# Patient Record
Sex: Female | Born: 1970 | Race: Black or African American | Hispanic: No | Marital: Married | Smoking: Never smoker
Health system: Southern US, Community
[De-identification: ages and names within clinical notes are randomized; demographics above are authoritative.]

## PROBLEM LIST (undated history)

## (undated) DIAGNOSIS — E079 Disorder of thyroid, unspecified: Secondary | ICD-10-CM

## (undated) DIAGNOSIS — Z800 Family history of malignant neoplasm of digestive organs: Secondary | ICD-10-CM

## (undated) DIAGNOSIS — R519 Headache, unspecified: Secondary | ICD-10-CM

## (undated) DIAGNOSIS — Z803 Family history of malignant neoplasm of breast: Secondary | ICD-10-CM

## (undated) DIAGNOSIS — F419 Anxiety disorder, unspecified: Secondary | ICD-10-CM

## (undated) DIAGNOSIS — I10 Essential (primary) hypertension: Secondary | ICD-10-CM

## (undated) DIAGNOSIS — Z801 Family history of malignant neoplasm of trachea, bronchus and lung: Secondary | ICD-10-CM

## (undated) DIAGNOSIS — R51 Headache: Secondary | ICD-10-CM

## (undated) HISTORY — PX: ABDOMINAL HYSTERECTOMY: SHX81

## (undated) HISTORY — PX: BREAST SURGERY: SHX581

## (undated) HISTORY — DX: Family history of malignant neoplasm of breast: Z80.3

## (undated) HISTORY — DX: Family history of malignant neoplasm of digestive organs: Z80.0

## (undated) HISTORY — DX: Family history of malignant neoplasm of trachea, bronchus and lung: Z80.1

---

## 1999-04-12 ENCOUNTER — Ambulatory Visit (HOSPITAL_COMMUNITY): Admission: RE | Admit: 1999-04-12 | Discharge: 1999-04-12 | Payer: Self-pay | Admitting: Family Medicine

## 1999-04-12 ENCOUNTER — Encounter: Payer: Self-pay | Admitting: Family Medicine

## 1999-11-12 HISTORY — PX: REDUCTION MAMMAPLASTY: SUR839

## 1999-12-05 ENCOUNTER — Other Ambulatory Visit: Admission: RE | Admit: 1999-12-05 | Discharge: 1999-12-05 | Payer: Self-pay | Admitting: Gynecology

## 1999-12-31 ENCOUNTER — Encounter (INDEPENDENT_AMBULATORY_CARE_PROVIDER_SITE_OTHER): Payer: Self-pay | Admitting: *Deleted

## 1999-12-31 ENCOUNTER — Ambulatory Visit (HOSPITAL_BASED_OUTPATIENT_CLINIC_OR_DEPARTMENT_OTHER): Admission: RE | Admit: 1999-12-31 | Discharge: 2000-01-01 | Payer: Self-pay | Admitting: Specialist

## 2002-10-21 ENCOUNTER — Encounter: Payer: Self-pay | Admitting: Emergency Medicine

## 2002-10-21 ENCOUNTER — Emergency Department (HOSPITAL_COMMUNITY): Admission: EM | Admit: 2002-10-21 | Discharge: 2002-10-21 | Payer: Self-pay | Admitting: Emergency Medicine

## 2002-10-25 ENCOUNTER — Ambulatory Visit (HOSPITAL_BASED_OUTPATIENT_CLINIC_OR_DEPARTMENT_OTHER): Admission: RE | Admit: 2002-10-25 | Discharge: 2002-10-25 | Payer: Self-pay | Admitting: *Deleted

## 2003-05-17 ENCOUNTER — Encounter: Payer: Self-pay | Admitting: Internal Medicine

## 2003-05-17 ENCOUNTER — Encounter: Admission: RE | Admit: 2003-05-17 | Discharge: 2003-05-17 | Payer: Self-pay | Admitting: Internal Medicine

## 2007-06-11 ENCOUNTER — Other Ambulatory Visit: Admission: RE | Admit: 2007-06-11 | Discharge: 2007-06-11 | Payer: Self-pay | Admitting: Gynecology

## 2007-09-02 ENCOUNTER — Encounter: Admission: RE | Admit: 2007-09-02 | Discharge: 2007-09-02 | Payer: Self-pay | Admitting: Gynecology

## 2008-09-05 ENCOUNTER — Encounter: Admission: RE | Admit: 2008-09-05 | Discharge: 2008-09-05 | Payer: Self-pay | Admitting: Obstetrics and Gynecology

## 2011-03-06 ENCOUNTER — Other Ambulatory Visit: Payer: Self-pay | Admitting: Obstetrics and Gynecology

## 2011-03-06 DIAGNOSIS — Z1231 Encounter for screening mammogram for malignant neoplasm of breast: Secondary | ICD-10-CM

## 2011-03-18 ENCOUNTER — Ambulatory Visit: Payer: Self-pay

## 2011-03-25 ENCOUNTER — Ambulatory Visit
Admission: RE | Admit: 2011-03-25 | Discharge: 2011-03-25 | Disposition: A | Discharge status: Home / Self Care | Payer: Commercial Managed Care - PPO | Source: Ambulatory Visit | Attending: Obstetrics and Gynecology | Admitting: Obstetrics and Gynecology

## 2011-03-25 DIAGNOSIS — Z1231 Encounter for screening mammogram for malignant neoplasm of breast: Secondary | ICD-10-CM

## 2011-03-29 NOTE — Op Note (Signed)
[place]  Patient:    Rebecca Daniels, Rebecca Daniels                    MRN: [id number] Proc. Date: 12/31/99 Attending:  Earvin Hansen L. Shon Hough, M.D. CC:         Yaakov Guthrie. Shon Hough, M.D. (2)                           Operative Report  INDICATIONS:  A 40 year old lady with severe macromastia, back and shoulder pain, secondary to large pendulous breasts.  She has tried conservative treatment in he past, which failed.  She has increased pitting of both shoulder areas as well as a history of intertrigo.  PROCEDURE:  Bilateral breast reductions using the inferior pedicle technique.  SURGEON:  Yaakov Guthrie. Shon Hough, M.D.  ASSISTANT:  Margaretha Sheffield.  All procedures in detail were explained to the patient, as well as the attendant risks and possible complications.  The patient consents to surgery.  DESCRIPTION OF PROCEDURE:  The patient was taken to the operating room and placed on the operating room table in the sitting position.  She was given drawings for the inferior pedicle reduction mammoplasty, remarking the nipple-areolar complexes to 20.0 cm from the suprasternal notch.  After this she then underwent general anesthesia, was intubated orally.  A prep was done to the chest and breast areas in a routine fashion using Betadine soap and solution, and walled off with sterile  towels and drapes, so as to make a sterile field.  The wounds were injected with 0.25% Xylocaine with epinephrine 1:400,000 concentration, a total of 150 cc per  side.  Next, the wounds were scored after the sterile towels and drapes had been placed, with a #15 blade and the skin of the inferior pedicle was de-epithelialized using a #20 blade.  Medial and lateral fatty dermal pedicles were excised down o underlying fascia.  Hemostasis was maintained with the Bovie unit again on coagulation.  Out laterally more tissue was taken to improve the lateral contour. After this, after  proper hemostasis, the new key hole area was also debulked, then the flaps were transposed and stayed with #3-0 Prolene.  The subcutaneous closure was done with #3-0 Vicryl x 2 layers, then a running subcuticular stitch of #3-0 Vicryl and #5-0 Vicryl throughout the inverted-T.  The wound were drained with 10 Blake drains, which were placed in the depths of the wound and brought out through the lateral-most portion of the incision and secured with #3-0 Prolene. Steri-Strips and soft dressing were applied, including Xeroform, 4 x 4s, ABDs, nd Hypafix tape.  She withstood the procedures very well.  The estimated blood loss was 100 cc. Complications:  None.  She was then taken to the recovery room in excellent condition.  The nipples were supple with good blood supply. DD:  12/31/99 TD:  12/31/99 Job: [id number]

## 2011-03-29 NOTE — Op Note (Signed)
NAME:  Rebecca Daniels, Rebecca Daniels                       ACCOUNT NO.:  [id number]   MEDICAL RECORD NO.:  [id number]                   PATIENT TYPE:  AMB   LOCATION:  DSC                                  FACILITY:  [facility]   PHYSICIAN:  Lowell Bouton, M.D.      DATE OF BIRTH:  April 19, 1971   DATE OF PROCEDURE:  DATE OF DISCHARGE:                                 OPERATIVE REPORT   PREOPERATIVE DIAGNOSIS:  Lacerated flexor pollicis longus tendon, left  thumb.   POSTOPERATIVE DIAGNOSIS:  Lacerated flexor pollicis longus tendon, left  thumb.   PROCEDURE:  Repair of flexor pollicis longus tendon of the left thumb.   SURGEON:  Lowell Bouton, M.D.   ANESTHESIA:  General.   OPERATIVE FINDINGS:  The patient had an oblique laceration of the flexor  pollicis longus tendon at the level of the MP joint.  The proximal end had  retracted down into the thenar eminence area.  The digital nerves were  intact.   PROCEDURE:  Under general anesthesia with a tourniquet on the left arm, the  left hand was prepped and draped in the usual fashion.  After elevating the  limb the tourniquet was inflated to 225 mmHg.  The  previous sutures were  removed and the laceration was extended proximally in a zig zag fashion.  Blunt dissection was carried down to the flexor sheath and the flaps were  retracted with skin hooks.  The distal end of the tendon was present in the  wound and the flexor sheath was identified.  A very fine hemostat was placed  through the sheath proximally down into thenar imminence attempting to  retrieve the proximal end of the tendon.  The tendon was milked distally by  flexing the wrist and squeezing on the forearm and the thenar imminence.  It  was not successfully retrieved.  A sterile tendon passer was then placed  proximally and again the tendon could not be retrieved.  At this point a  longitudinal incision was made over the volar aspect of the wrist overlying  the FCR  tendon.  Blunt dissection was carried to the subcutaneous tissues  and bleeding points were coagulated.  Sharp dissection was carried through  the FCR sheath and the tendon was retracted ulnarly.  The floor of the  sheath was incised longitudinally and blunt dissection was carried down to  the FPL tendon.  The proximal end of the tendon was grasped with the forceps  and on pulling on it the tendon did not come into the wrist. It was  obviously adherent along the thenar imminence area.  The incision on the  thumb was then made more proximal.  A longitudinal incision was made in the  tendon sheath proximal to the MP area and a fine hemostat was placed into  the sheath eventually getting the proximal portion of the tendon that was  lacerated.  This was pulled out distally and a 3-0 [employer]  suture  was inserted along with a second 3-0 Ethibond core suture.  The distal end  of the tendon had a 3-0 Ethibond placed in a core suture and was passed from  distal to proximal at the level of the A1 pulley.  The repair was then  performed just proximal to the A1 pulley after putting 25 gauge needles in  the ends of the tendon to keep them from retracting.  The core suture was  repaired to the proximal core suture and then the Kessler suture was used  from the proximal tendon and the repair performed in a Kessler fashion on  the distal stump.  This allowed two core sutures.  The epitenon stitch was  performed with a 5-0 nylon suture.  This allowed for good gliding of the  tendon.  The wound was irrigated copiously with saline.  The skin was closed  with 4-0 nylon sutures.  The proximal wound was irrigated.  The subcutaneous  tissue was closed  with 4-0 Vicryl and the skin with a 3-0 subcuticular Prolene.  Steri-Strips  were applied followed by sterile dressings and a thumb spica splint.  The  tourniquet was released with good circulation of the hand.  1/2% Marcaine  was inserted into the wounds  for pain control.  The patient went to the  recovery room in stable good condition.                                               Lowell Bouton, M.D.    EMM/MEDQ  D:  10/25/2002  T:  10/25/2002  Job:  [id number]

## 2012-05-05 ENCOUNTER — Other Ambulatory Visit: Payer: Self-pay | Admitting: *Deleted

## 2012-05-05 ENCOUNTER — Other Ambulatory Visit: Payer: Self-pay | Admitting: Obstetrics and Gynecology

## 2012-05-05 DIAGNOSIS — Z1231 Encounter for screening mammogram for malignant neoplasm of breast: Secondary | ICD-10-CM

## 2012-05-13 ENCOUNTER — Ambulatory Visit
Admission: RE | Admit: 2012-05-13 | Discharge: 2012-05-13 | Disposition: A | Discharge status: Home / Self Care | Payer: Commercial Managed Care - PPO | Source: Ambulatory Visit | Attending: Obstetrics and Gynecology | Admitting: Obstetrics and Gynecology

## 2012-05-13 DIAGNOSIS — Z1231 Encounter for screening mammogram for malignant neoplasm of breast: Secondary | ICD-10-CM

## 2013-08-06 ENCOUNTER — Other Ambulatory Visit: Payer: Self-pay

## 2013-08-06 DIAGNOSIS — Z1231 Encounter for screening mammogram for malignant neoplasm of breast: Secondary | ICD-10-CM

## 2013-08-20 ENCOUNTER — Ambulatory Visit
Admission: RE | Admit: 2013-08-20 | Discharge: 2013-08-20 | Disposition: A | Discharge status: Home / Self Care | Payer: Commercial Managed Care - PPO | Source: Ambulatory Visit

## 2013-08-20 DIAGNOSIS — Z1231 Encounter for screening mammogram for malignant neoplasm of breast: Secondary | ICD-10-CM

## 2014-11-25 ENCOUNTER — Emergency Department (HOSPITAL_BASED_OUTPATIENT_CLINIC_OR_DEPARTMENT_OTHER)
Admission: EM | Admit: 2014-11-25 | Discharge: 2014-11-26 | Disposition: A | Discharge status: Home / Self Care | Payer: BLUE CROSS/BLUE SHIELD | Attending: Emergency Medicine | Admitting: Emergency Medicine

## 2014-11-25 ENCOUNTER — Encounter (HOSPITAL_BASED_OUTPATIENT_CLINIC_OR_DEPARTMENT_OTHER): Payer: Self-pay | Admitting: Emergency Medicine

## 2014-11-25 DIAGNOSIS — E079 Disorder of thyroid, unspecified: Secondary | ICD-10-CM | POA: Insufficient documentation

## 2014-11-25 DIAGNOSIS — Y9389 Activity, other specified: Secondary | ICD-10-CM | POA: Insufficient documentation

## 2014-11-25 DIAGNOSIS — W228XXA Striking against or struck by other objects, initial encounter: Secondary | ICD-10-CM | POA: Diagnosis not present

## 2014-11-25 DIAGNOSIS — Y9289 Other specified places as the place of occurrence of the external cause: Secondary | ICD-10-CM | POA: Insufficient documentation

## 2014-11-25 DIAGNOSIS — I10 Essential (primary) hypertension: Secondary | ICD-10-CM | POA: Insufficient documentation

## 2014-11-25 DIAGNOSIS — S75212A Minor laceration of greater saphenous vein at hip and thigh level, left leg, initial encounter: Secondary | ICD-10-CM | POA: Diagnosis present

## 2014-11-25 DIAGNOSIS — Y998 Other external cause status: Secondary | ICD-10-CM | POA: Diagnosis not present

## 2014-11-25 DIAGNOSIS — F419 Anxiety disorder, unspecified: Secondary | ICD-10-CM | POA: Diagnosis not present

## 2014-11-25 DIAGNOSIS — Z79899 Other long term (current) drug therapy: Secondary | ICD-10-CM | POA: Diagnosis not present

## 2014-11-25 DIAGNOSIS — S71112A Laceration without foreign body, left thigh, initial encounter: Secondary | ICD-10-CM

## 2014-11-25 HISTORY — DX: Disorder of thyroid, unspecified: E07.9

## 2014-11-25 HISTORY — DX: Essential (primary) hypertension: I10

## 2014-11-25 HISTORY — DX: Anxiety disorder, unspecified: F41.9

## 2014-11-25 HISTORY — DX: Headache: R51

## 2014-11-25 HISTORY — DX: Headache, unspecified: R51.9

## 2014-11-25 MED ORDER — LIDOCAINE-EPINEPHRINE 1 %-1:100000 IJ SOLN
INTRAMUSCULAR | Status: AC
  Administered 2014-11-25: 1 mL
  Filled 2014-11-25: qty 1

## 2014-11-25 MED ORDER — CEPHALEXIN 500 MG PO CAPS: 500.0000 mg | ORAL_CAPSULE | Freq: Four times a day (QID) | ORAL | Status: AC

## 2014-11-25 MED ORDER — CEPHALEXIN 250 MG PO CAPS
1000.0000 mg | ORAL_CAPSULE | Freq: Once | ORAL | Status: AC
  Administered 2014-11-25: 1000 mg via ORAL
  Filled 2014-11-25: qty 4

## 2014-11-25 NOTE — ED Notes (Signed)
Patient states that she cut her left lower extremity on a truck this evening.

## 2014-11-25 NOTE — ED Provider Notes (Signed)
CSN: [id number]     Arrival date & time 11/25/14  2259 History  This chart was scribed for Rebecca SeamenJohn L Milianna Ericsson, MD by Richarda Overlieichard Holland, ED Scribe. This patient was seen in room MH06/MH06 and the patient's care was started 11:26 PM.    Chief Complaint  Patient presents with  . Extremity Laceration   The history is provided by the patient. No language interpreter was used.   HPI Comments: Rebecca NiemannCarolista M Wiler is a 44 y.o. female who presents to the Emergency Department complaining of a left thigh laceration that occurred earlier this evening. Pt states she cut her left thigh on a truck when they were moving. She reports minimal associated pain and the wound only bled slightly. Pt reports she is UTD on her tetanus. She reports no alleviating or modifying factors at this time. She denies other injury.   Past Medical History  Diagnosis Date  . Hypertension   . Headache   . Thyroid disease   . Anxiety    Past Surgical History  Procedure Laterality Date  . Cesarean section    . Abdominal hysterectomy    . Breast surgery     History reviewed. No pertinent family history. History  Substance Use Topics  . Smoking status: Never Smoker   . Smokeless tobacco: Not on file  . Alcohol Use: No   OB History    No data available     Review of Systems  Skin: Positive for wound.  All other systems reviewed and are negative.   Allergies  Review of patient's allergies indicates no known allergies.  Home Medications   Prior to Admission medications   Medication Sig Start Date End Date Taking? Authorizing Provider  citalopram (CELEXA) 20 MG tablet Take 20 mg by mouth daily.   Yes Historical Provider, MD  hydrochlorothiazide (MICROZIDE) 12.5 MG capsule Take 12.5 mg by mouth daily.   Yes Historical Provider, MD  levothyroxine (SYNTHROID, LEVOTHROID) 112 MCG tablet Take 112 mcg by mouth daily before breakfast.   Yes Historical Provider, MD  lisinopril (PRINIVIL,ZESTRIL) 20 MG tablet Take 20 mg by mouth  daily.   Yes Historical Provider, MD   BP 143/95 mmHg  Pulse 65  Temp(Src) 98 F (36.7 C) (Oral)  Resp 16  Wt 188 lb (85.276 kg)  SpO2 100%  LMP 02/23/2011   Physical Exam  General: Well-developed, well-nourished female in no acute distress; appearance consistent with age of record HENT: normocephalic; atraumatic Eyes: pupils equal, round and reactive to light; extraocular muscles intact Neck: supple Heart: regular rate and rhythm Lungs: clear to auscultation bilaterally Abdomen: soft; nondistended; nontender; no masses or hepatosplenomegaly; bowel sounds present Extremities: No deformity; full range of motion; pulses normal; laceration to left anterior lateral thigh just proximal to the knee.   Neurologic: Awake, alert and oriented; motor function intact in all extremities and symmetric; no facial droop Skin: Warm and dry Psychiatric: Normal mood and affect  ED Course  Procedures   DIAGNOSTIC STUDIES: Oxygen Saturation is 100% on RA, normal by my interpretation.    COORDINATION OF CARE: 11:29 PM Discussed treatment plan with pt at bedside and pt agreed to plan.  LACERATION REPAIR Performed by: Rebecca SeamenMOLPUS,Kanaya Gunnarson L Authorized by: Rebecca SeamenMOLPUS,Zakariah Dejarnette L Consent: Verbal consent obtained. Risks and benefits: risks, benefits and alternatives were discussed Consent given by: patient Patient identity confirmed: provided demographic data Prepped and Draped in normal sterile fashion Wound explored  Laceration Location: Left anterior lateral thigh  Laceration Length: 4 cm  No Foreign Bodies  seen or palpated  Anesthesia: local infiltration  Local anesthetic: lidocaine 1 % with epinephrine  Anesthetic total: 5 ml  Irrigation method: syringe Amount of cleaning: standard  Subcuticular closure: 3-0 Prolene  Number of sutures: 2  Technique: Simple interrupted  Skin closure: 4-0 Prolene   Number of sutures: 7   Technique: Simple interrupted   Patient tolerance: Patient tolerated  the procedure well with no immediate complications.    MDM  I personally performed the services described in this documentation, which was scribed in my presence. The recorded information has been reviewed and is accurate.    Rebecca Seamen, MD 11/25/14 [phone]

## 2015-03-03 ENCOUNTER — Other Ambulatory Visit: Payer: Self-pay | Admitting: Obstetrics and Gynecology

## 2015-03-03 DIAGNOSIS — Z1231 Encounter for screening mammogram for malignant neoplasm of breast: Secondary | ICD-10-CM

## 2015-03-10 ENCOUNTER — Ambulatory Visit
Admission: RE | Admit: 2015-03-10 | Discharge: 2015-03-10 | Disposition: A | Discharge status: Home / Self Care | Payer: BLUE CROSS/BLUE SHIELD | Source: Ambulatory Visit | Attending: Obstetrics and Gynecology | Admitting: Obstetrics and Gynecology

## 2015-03-10 DIAGNOSIS — Z1231 Encounter for screening mammogram for malignant neoplasm of breast: Secondary | ICD-10-CM

## 2016-10-16 ENCOUNTER — Other Ambulatory Visit: Payer: Self-pay | Admitting: Obstetrics and Gynecology

## 2016-10-16 DIAGNOSIS — Z1231 Encounter for screening mammogram for malignant neoplasm of breast: Secondary | ICD-10-CM

## 2016-10-17 ENCOUNTER — Ambulatory Visit
Admission: RE | Admit: 2016-10-17 | Discharge: 2016-10-17 | Disposition: A | Discharge status: Home / Self Care | Payer: BLUE CROSS/BLUE SHIELD | Source: Ambulatory Visit | Attending: Obstetrics and Gynecology | Admitting: Obstetrics and Gynecology

## 2016-10-17 DIAGNOSIS — Z1231 Encounter for screening mammogram for malignant neoplasm of breast: Secondary | ICD-10-CM

## 2018-01-08 ENCOUNTER — Other Ambulatory Visit: Payer: Self-pay | Admitting: Obstetrics and Gynecology

## 2018-01-08 DIAGNOSIS — Z1231 Encounter for screening mammogram for malignant neoplasm of breast: Secondary | ICD-10-CM

## 2018-01-27 ENCOUNTER — Ambulatory Visit
Admission: RE | Admit: 2018-01-27 | Discharge: 2018-01-27 | Disposition: A | Discharge status: Home / Self Care | Payer: BLUE CROSS/BLUE SHIELD | Source: Ambulatory Visit | Attending: Obstetrics and Gynecology | Admitting: Obstetrics and Gynecology

## 2018-01-27 DIAGNOSIS — Z1231 Encounter for screening mammogram for malignant neoplasm of breast: Secondary | ICD-10-CM

## 2018-11-24 ENCOUNTER — Other Ambulatory Visit: Payer: Self-pay | Admitting: Obstetrics and Gynecology

## 2018-11-24 DIAGNOSIS — N644 Mastodynia: Secondary | ICD-10-CM

## 2018-11-27 ENCOUNTER — Other Ambulatory Visit: Payer: BLUE CROSS/BLUE SHIELD

## 2018-11-30 ENCOUNTER — Ambulatory Visit: Payer: BLUE CROSS/BLUE SHIELD

## 2018-11-30 ENCOUNTER — Ambulatory Visit
Admission: RE | Admit: 2018-11-30 | Discharge: 2018-11-30 | Disposition: A | Discharge status: Home / Self Care | Payer: BLUE CROSS/BLUE SHIELD | Source: Ambulatory Visit | Attending: Obstetrics and Gynecology | Admitting: Obstetrics and Gynecology

## 2018-11-30 DIAGNOSIS — N644 Mastodynia: Secondary | ICD-10-CM

## 2019-08-24 ENCOUNTER — Telehealth: Payer: Self-pay | Admitting: Licensed Clinical Social Worker

## 2019-08-24 NOTE — Telephone Encounter (Signed)
I received a genetic counseling referral from Dr. Alessandra Bevels at [facility] for a fhx of pancreatic cancer. Rebecca Daniels has been cld and scheduled to see Faith Rogue on 10/19 at 2pm. She's been made aware to arrive 15 minutes early.

## 2019-08-25 ENCOUNTER — Telehealth: Payer: Self-pay | Admitting: Genetic Counselor

## 2019-08-25 NOTE — Telephone Encounter (Signed)
Pt cld to reschedule genetic counseling appt w/Emily on 10/29 at 1pm.

## 2019-08-30 ENCOUNTER — Other Ambulatory Visit: Payer: BLUE CROSS/BLUE SHIELD

## 2019-08-30 ENCOUNTER — Encounter: Payer: BLUE CROSS/BLUE SHIELD | Admitting: Licensed Clinical Social Worker

## 2019-09-07 ENCOUNTER — Telehealth: Payer: Self-pay | Admitting: Genetic Counselor

## 2019-09-07 NOTE — Telephone Encounter (Signed)
Called patient regarding upcoming [facility] appointment, left a voicemail. This will be a walk-in visit. °

## 2019-09-09 ENCOUNTER — Inpatient Hospital Stay: Payer: 59 | Attending: Genetic Counselor | Admitting: Genetic Counselor

## 2019-09-09 ENCOUNTER — Encounter: Payer: Self-pay | Admitting: Genetic Counselor

## 2019-09-09 ENCOUNTER — Inpatient Hospital Stay: Payer: 59

## 2019-09-09 ENCOUNTER — Other Ambulatory Visit: Payer: Self-pay

## 2019-09-09 ENCOUNTER — Other Ambulatory Visit: Payer: Self-pay | Admitting: Genetic Counselor

## 2019-09-09 DIAGNOSIS — Z800 Family history of malignant neoplasm of digestive organs: Secondary | ICD-10-CM | POA: Diagnosis not present

## 2019-09-09 DIAGNOSIS — Z803 Family history of malignant neoplasm of breast: Secondary | ICD-10-CM | POA: Diagnosis not present

## 2019-09-09 DIAGNOSIS — Z801 Family history of malignant neoplasm of trachea, bronchus and lung: Secondary | ICD-10-CM

## 2019-09-09 NOTE — Progress Notes (Signed)
g

## 2019-09-09 NOTE — Progress Notes (Signed)
REFERRING PROVIDER: Otis Brace, MD [place],  [place] [address]  PRIMARY PROVIDER:  Ardith Dark, PA-C  PRIMARY REASON FOR VISIT:  1. Family history of pancreatic cancer   2. Family history of breast cancer   3. Family history of stomach cancer   4. Family history of lung cancer     HISTORY OF PRESENT ILLNESS:   [name], a 48 y.o. female, was seen for a [facility] cancer genetics consultation at the request of Dr. Alessandra Bevels due to a family history of pancreatic, breast, stomach, lung, and anal cancer.  Rebecca Daniels presents to clinic today to discuss the possibility of a hereditary predisposition to cancer, genetic testing, and to further clarify her future cancer risks, as well as potential cancer risks for family members.   Rebecca Daniels is a 48 y.o. female with no personal history of cancer.    CANCER HISTORY:  Oncology History   No history exists.     RISK FACTORS:  Menarche was at age 13.  First live birth at age 38.  OCP use for approximately 15 years.  Ovaries intact: yes.  Hysterectomy: yes.  Menopausal status: perimenopausal.  HRT use: 0 years. Colonoscopy: no. Mammogram within the last year: yes. Number of breast biopsies: 0.   Past Medical History:  Diagnosis Date   Anxiety    Family history of breast cancer    Family history of lung cancer    Family history of pancreatic cancer    Family history of stomach cancer    Headache    Hypertension    Thyroid disease     Past Surgical History:  Procedure Laterality Date   ABDOMINAL HYSTERECTOMY     BREAST SURGERY     CESAREAN SECTION     REDUCTION MAMMAPLASTY Bilateral 2001    Social History   Socioeconomic History   Marital status: Married    Spouse name: Not on file   Number of children: Not on file   Years of education: Not on file   Highest education level: Not on file  Occupational History   Not on file  Social Needs   Financial resource  strain: Not on file   Food insecurity    Worry: Not on file    Inability: Not on file   Transportation needs    Medical: Not on file    Non-medical: Not on file  Tobacco Use   Smoking status: Never Smoker  Substance and Sexual Activity   Alcohol use: No   Drug use: No   Sexual activity: Not on file  Lifestyle   Physical activity    Days per week: Not on file    Minutes per session: Not on file   Stress: Not on file  Relationships   Social connections    Talks on phone: Not on file    Gets together: Not on file    Attends religious service: Not on file    Active member of club or organization: Not on file    Attends meetings of clubs or organizations: Not on file    Relationship status: Not on file  Other Topics Concern   Not on file  Social History Narrative   Not on file     FAMILY HISTORY:  We obtained a detailed, 4-generation family history.  Significant diagnoses are listed below: Family History  Problem Relation Age of Onset   Breast cancer Cousin        diagnosed 10s  Breast cancer Cousin        diagnosed 100s   Cancer Cousin        anal cancer   Cancer Cousin        unknown cancer, diagnosed 53s   Cancer Cousin    Cancer Father 26       unsure what primary, possibly pancreatic   Pancreatic cancer Paternal Grandmother 83   Pancreatic cancer Other        diagnosed 29s, paternal 56th degree relative   Lung cancer Maternal Aunt    Stomach cancer Maternal Aunt        diagnosed early 36s   Cancer Maternal Uncle        unknown type (possibly lung), diagnosed 60s   HIV/AIDS Paternal Uncle    Alzheimer's disease Paternal Aunt     Rebecca Daniels has two sons, ages 61 and 26. She does not have any siblings.  Her mother died at age 66 and did not have cancer. She has nine maternal aunts and five maternal uncles, most of whom have died. One aunt was diagnosed with lung and stomach cancer in her early 86s and was a smoker. Another aunt who is  currently living has a colostomy bag, but Rebecca Daniels is unsure if this is due to cancer. One of her uncles died from an unknown type of cancer in his 78s, which she believes may have been lung cancer. She also has multiple maternal cousins who have had cancer: one who was diagnosed with breast cancer in her 52s, one diagnosed with anal and breast cancer in her 36s, one diagnosed with an unknown type of cancer in her 26s, and one diagnosed with an unknown cancer at an unknown age. Her maternal grandmother died in her 38s, and grandfather died in his 8s in a car accident.  Rebecca Daniels father died at age 44 from cancer that had an unknown primary, but involved his pancreas. She has eight paternal aunts and five paternal uncles, none of whom have had cancer. Her paternal grandfather died at age 10 in a car accident, and her grandmother died at age 1 from pancreatic cancer. Her grandmother also had a niece who died from pancreatic cancer in her 9s.   Rebecca Daniels is unaware of previous family history of genetic testing for hereditary cancer risks. Patient's maternal ancestors are of [place] American, Caucasian, and [place] descent, and paternal ancestors are of [place] American, Caucasian, and [place] descent. There is no reported Ashkenazi Jewish ancestry. There is no known consanguinity.  GENETIC COUNSELING ASSESSMENT: Rebecca Daniels is a 48 y.o. female with a family history of pancreatic and breast cancer, which is somewhat suggestive of a hereditary cancer syndrome and predisposition to cancer. We, therefore, discussed and recommended the following at today's visit.   DISCUSSION: We discussed that 5 - 10% of cancer is hereditary, with most cases of hereditary pancreatic cancer associated with the BRCA genes.  There are other genes that can be associated with hereditary pancreatic cancer syndromes.  These include CDKN2A, STK11, PALB2, ATM, APC, TP53, the Lynch syndrome genes, and the hereditary pancreatitis genes.   We discussed that testing and identifying a hereditary cancer syndrome can be beneficial for several reasons including knowing about other cancer risks, identifying potential screening and risk-reduction options that may be appropriate, and to understand if other family members could be at risk for cancer and allow them to undergo genetic testing.   Familial pancreatic cancer is defined as having two or more  individuals who are first degree relatives to one another diagnosed with pancreatic cancer within a family, or having three or more relatives on the same side of a family diagnosed with pancreatic cancer. Even if a hereditary cause is not identified, individuals in the family are likely at an increased risk for pancreatic cancer simply based on the family history. Having a first degree relative with pancreatic cancer, for example, increases the lifetime risk to develop pancreatic cancer to approximately 4-6% (compared to 1-2% in the general population).   We reviewed the characteristics, features and inheritance patterns of hereditary cancer syndromes. We also discussed genetic testing, including the appropriate family members to test, the process of testing, insurance coverage and turn-around-time for results. We discussed the implications of a negative, positive and/or variant of uncertain significant result. We recommended Ms. Currin pursue genetic testing for the [facility] CustomNext-Cancer+RNAinsight panel, if she is interested in testing.  The CustomNext-Cancer+RNAinsight panel offered by [facility] includes sequencing and rearrangement analysis for the following 53 genes:  APC*, ATM*, AXIN2, BARD1, BMPR1A, BRCA1*, BRCA2*, BRIP1*, CASR, CDH1*, CDK4, CDKN2A, CFTR, CHEK2*, CPA1, CTRC, DICER1, EPCAM, GREM1, HOXB13, MEN1, MLH1*, MSH2*, MSH3, MSH6*, MUTYH*, NBN, NF1*, NF2, NTHL1, PALB2*, PMS2*, POLD1, POLE, PRSS1, PTEN*, RAD51C*, RAD51D*, RECQL, RET, SDHA, SDHAF2, SDHB, SDHC, SDHD, SMAD4, SMARCA4, SPINK1,  STK11, TP53*, TSC1, TSC2, and VHL. DNA and RNA analyses performed for * genes.  Based on Ms. Sarff family history of cancer, she meets medical criteria for genetic testing. Despite that she meets criteria, she may still have an out of pocket cost. We discussed that if her out of pocket cost for testing is over $100, the laboratory will call and confirm whether she wants to proceed with testing.  If the out of pocket cost of testing is less than $100 she will be billed by the genetic testing laboratory.   We discussed that some people do not want to undergo genetic testing due to fear of genetic discrimination.  A federal law called the Genetic Information Non-Discrimination Act (GINA) of 2008 helps protect individuals against genetic discrimination based on their genetic test results.  It impacts both health insurance and employment.  With health insurance, it protects against increased premiums, being kicked off insurance or being forced to take a test in order to be insured.  For employment it protects against hiring, firing and promoting decisions based on genetic test results.  Health status due to a cancer diagnosis is not protected under GINA.  Additionally, life, disability, and long-term care insurance is not protected under GINA.   Based on the patient's family history, statistical models (Tyrer-Cuzick and the [place]) were used to estimate her risk of developing breast cancer. Tyrer-Cuzick estimates her lifetime risk of developing breast cancer to be approximately 6.8%. This estimation does not consider any genetic testing results.  The patient's lifetime breast cancer risk is a preliminary estimate based on available information using one of several models endorsed by the [place] (ACS). The ACS recommends consideration of breast MRI screening as an adjunct to mammography for patients at high risk (defined as 20% or greater lifetime risk).  The [place]ail model estimates Ms. Latulippe  5-year risk for breast cancer to be approximately 1.1%. Consideration for chemoprevention is recommended for women who have a 5-year breast cancer risk of 1.7% or greater.  Tyrer-Cuzick:  Baker Janus Model:    PLAN: Despite our recommendation, Ms. Mabin did not wish to pursue genetic testing at today's visit. We understand this decision and  remain available to coordinate genetic testing at any time in the future. We, therefore, recommend Ms. Lubas continue to follow the cancer screening guidelines given by her primary healthcare provider.  Based on Ms. Plott family history, we recommended her maternal, who was diagnosed with breast cancer in her 53s, have genetic counseling and testing. Ms. Vandivier will let us know if we can be of any assistance in coordinating genetic counseling and/or testing for this family member.   Lastly, we encouraged Ms. Griffeth to remain in contact with cancer genetics annually so that we can continuously update the family history and inform her of any changes in cancer genetics and testing that may be of benefit for this family.   Ms. Lineman questions were answered to her satisfaction today. Our contact information was provided should additional questions or concerns arise. Thank you for the referral and allowing [place] to share in the care of your patient.   Clint Guy, MS, [facility] Certified Genetic Counselor [email or web address] Phone: [phone]  The patient was seen for a total of 50 minutes in face-to-face genetic counseling.  This patient was discussed with Drs. Magrinat, Lindi Adie and/or Burr Medico who agrees with the above.    _______________________________________________________________________ For Office Staff:  Number of people involved in session: 1 Was an Intern/ student involved with case: no

## 2019-12-10 ENCOUNTER — Other Ambulatory Visit: Payer: Self-pay | Admitting: Obstetrics and Gynecology

## 2019-12-10 DIAGNOSIS — Z1231 Encounter for screening mammogram for malignant neoplasm of breast: Secondary | ICD-10-CM

## 2020-01-25 ENCOUNTER — Ambulatory Visit: Payer: 59

## 2020-03-03 ENCOUNTER — Other Ambulatory Visit: Payer: Self-pay

## 2020-03-03 ENCOUNTER — Ambulatory Visit
Admission: RE | Admit: 2020-03-03 | Discharge: 2020-03-03 | Disposition: A | Discharge status: Home / Self Care | Payer: 59 | Source: Ambulatory Visit | Attending: Obstetrics and Gynecology | Admitting: Obstetrics and Gynecology

## 2020-03-03 DIAGNOSIS — Z1231 Encounter for screening mammogram for malignant neoplasm of breast: Secondary | ICD-10-CM

## 2020-11-17 DIAGNOSIS — R69 Illness, unspecified: Secondary | ICD-10-CM | POA: Diagnosis not present

## 2020-11-17 DIAGNOSIS — F410 Panic disorder [episodic paroxysmal anxiety] without agoraphobia: Secondary | ICD-10-CM | POA: Diagnosis not present

## 2020-11-17 DIAGNOSIS — Z79899 Other long term (current) drug therapy: Secondary | ICD-10-CM | POA: Diagnosis not present

## 2021-01-15 DIAGNOSIS — M62830 Muscle spasm of back: Secondary | ICD-10-CM | POA: Diagnosis not present

## 2021-01-15 DIAGNOSIS — M791 Myalgia, unspecified site: Secondary | ICD-10-CM | POA: Diagnosis not present

## 2021-01-15 DIAGNOSIS — M5417 Radiculopathy, lumbosacral region: Secondary | ICD-10-CM | POA: Diagnosis not present

## 2021-01-15 DIAGNOSIS — M9905 Segmental and somatic dysfunction of pelvic region: Secondary | ICD-10-CM | POA: Diagnosis not present

## 2021-01-15 DIAGNOSIS — M9901 Segmental and somatic dysfunction of cervical region: Secondary | ICD-10-CM | POA: Diagnosis not present

## 2021-01-15 DIAGNOSIS — M9903 Segmental and somatic dysfunction of lumbar region: Secondary | ICD-10-CM | POA: Diagnosis not present

## 2021-01-15 DIAGNOSIS — M9902 Segmental and somatic dysfunction of thoracic region: Secondary | ICD-10-CM | POA: Diagnosis not present

## 2021-01-23 DIAGNOSIS — M791 Myalgia, unspecified site: Secondary | ICD-10-CM | POA: Diagnosis not present

## 2021-01-23 DIAGNOSIS — M9903 Segmental and somatic dysfunction of lumbar region: Secondary | ICD-10-CM | POA: Diagnosis not present

## 2021-01-23 DIAGNOSIS — M9901 Segmental and somatic dysfunction of cervical region: Secondary | ICD-10-CM | POA: Diagnosis not present

## 2021-01-23 DIAGNOSIS — M9905 Segmental and somatic dysfunction of pelvic region: Secondary | ICD-10-CM | POA: Diagnosis not present

## 2021-01-23 DIAGNOSIS — M62830 Muscle spasm of back: Secondary | ICD-10-CM | POA: Diagnosis not present

## 2021-01-23 DIAGNOSIS — M9902 Segmental and somatic dysfunction of thoracic region: Secondary | ICD-10-CM | POA: Diagnosis not present

## 2021-01-23 DIAGNOSIS — M5417 Radiculopathy, lumbosacral region: Secondary | ICD-10-CM | POA: Diagnosis not present

## 2021-02-15 DIAGNOSIS — M25562 Pain in left knee: Secondary | ICD-10-CM | POA: Diagnosis not present

## 2021-03-12 DIAGNOSIS — M25562 Pain in left knee: Secondary | ICD-10-CM | POA: Diagnosis not present

## 2021-03-22 DIAGNOSIS — M25562 Pain in left knee: Secondary | ICD-10-CM | POA: Diagnosis not present

## 2021-03-28 DIAGNOSIS — R3129 Other microscopic hematuria: Secondary | ICD-10-CM | POA: Diagnosis not present

## 2021-04-06 ENCOUNTER — Other Ambulatory Visit: Payer: Self-pay | Admitting: Obstetrics and Gynecology

## 2021-04-06 DIAGNOSIS — Z1231 Encounter for screening mammogram for malignant neoplasm of breast: Secondary | ICD-10-CM

## 2021-04-11 DIAGNOSIS — E890 Postprocedural hypothyroidism: Secondary | ICD-10-CM | POA: Diagnosis not present

## 2021-04-11 DIAGNOSIS — R7309 Other abnormal glucose: Secondary | ICD-10-CM | POA: Diagnosis not present

## 2021-04-11 DIAGNOSIS — E559 Vitamin D deficiency, unspecified: Secondary | ICD-10-CM | POA: Diagnosis not present

## 2021-04-14 ENCOUNTER — Other Ambulatory Visit: Payer: Self-pay

## 2021-04-14 ENCOUNTER — Ambulatory Visit
Admission: RE | Admit: 2021-04-14 | Discharge: 2021-04-14 | Disposition: A | Discharge status: Home / Self Care | Payer: 59 | Source: Ambulatory Visit | Attending: Obstetrics and Gynecology | Admitting: Obstetrics and Gynecology

## 2021-04-14 DIAGNOSIS — Z1231 Encounter for screening mammogram for malignant neoplasm of breast: Secondary | ICD-10-CM

## 2021-06-13 DIAGNOSIS — Z01419 Encounter for gynecological examination (general) (routine) without abnormal findings: Secondary | ICD-10-CM | POA: Diagnosis not present

## 2021-06-13 DIAGNOSIS — R232 Flushing: Secondary | ICD-10-CM | POA: Diagnosis not present

## 2021-06-19 DIAGNOSIS — M2011 Hallux valgus (acquired), right foot: Secondary | ICD-10-CM | POA: Diagnosis not present

## 2021-06-19 DIAGNOSIS — M19071 Primary osteoarthritis, right ankle and foot: Secondary | ICD-10-CM | POA: Diagnosis not present

## 2021-06-19 DIAGNOSIS — M722 Plantar fascial fibromatosis: Secondary | ICD-10-CM | POA: Diagnosis not present

## 2021-06-19 DIAGNOSIS — M65872 Other synovitis and tenosynovitis, left ankle and foot: Secondary | ICD-10-CM | POA: Diagnosis not present

## 2021-08-30 DIAGNOSIS — M25571 Pain in right ankle and joints of right foot: Secondary | ICD-10-CM | POA: Diagnosis not present

## 2021-08-30 DIAGNOSIS — M545 Low back pain, unspecified: Secondary | ICD-10-CM | POA: Diagnosis not present

## 2021-09-24 DIAGNOSIS — M25571 Pain in right ankle and joints of right foot: Secondary | ICD-10-CM | POA: Diagnosis not present

## 2021-10-05 DIAGNOSIS — M545 Low back pain, unspecified: Secondary | ICD-10-CM | POA: Diagnosis not present

## 2021-10-11 DIAGNOSIS — M9905 Segmental and somatic dysfunction of pelvic region: Secondary | ICD-10-CM | POA: Diagnosis not present

## 2021-10-11 DIAGNOSIS — M9901 Segmental and somatic dysfunction of cervical region: Secondary | ICD-10-CM | POA: Diagnosis not present

## 2021-10-11 DIAGNOSIS — M9902 Segmental and somatic dysfunction of thoracic region: Secondary | ICD-10-CM | POA: Diagnosis not present

## 2021-10-11 DIAGNOSIS — M62830 Muscle spasm of back: Secondary | ICD-10-CM | POA: Diagnosis not present

## 2021-10-11 DIAGNOSIS — M9903 Segmental and somatic dysfunction of lumbar region: Secondary | ICD-10-CM | POA: Diagnosis not present

## 2021-10-11 DIAGNOSIS — M5417 Radiculopathy, lumbosacral region: Secondary | ICD-10-CM | POA: Diagnosis not present

## 2021-10-11 DIAGNOSIS — M25551 Pain in right hip: Secondary | ICD-10-CM | POA: Diagnosis not present

## 2021-10-11 DIAGNOSIS — E890 Postprocedural hypothyroidism: Secondary | ICD-10-CM | POA: Diagnosis not present

## 2021-10-11 DIAGNOSIS — M791 Myalgia, unspecified site: Secondary | ICD-10-CM | POA: Diagnosis not present

## 2021-10-11 DIAGNOSIS — R7309 Other abnormal glucose: Secondary | ICD-10-CM | POA: Diagnosis not present

## 2021-10-11 DIAGNOSIS — E559 Vitamin D deficiency, unspecified: Secondary | ICD-10-CM | POA: Diagnosis not present

## 2021-10-22 DIAGNOSIS — M62830 Muscle spasm of back: Secondary | ICD-10-CM | POA: Diagnosis not present

## 2021-10-22 DIAGNOSIS — M9902 Segmental and somatic dysfunction of thoracic region: Secondary | ICD-10-CM | POA: Diagnosis not present

## 2021-10-22 DIAGNOSIS — M5417 Radiculopathy, lumbosacral region: Secondary | ICD-10-CM | POA: Diagnosis not present

## 2021-10-22 DIAGNOSIS — M9901 Segmental and somatic dysfunction of cervical region: Secondary | ICD-10-CM | POA: Diagnosis not present

## 2021-10-22 DIAGNOSIS — M9905 Segmental and somatic dysfunction of pelvic region: Secondary | ICD-10-CM | POA: Diagnosis not present

## 2021-10-22 DIAGNOSIS — M9903 Segmental and somatic dysfunction of lumbar region: Secondary | ICD-10-CM | POA: Diagnosis not present

## 2021-10-22 DIAGNOSIS — M791 Myalgia, unspecified site: Secondary | ICD-10-CM | POA: Diagnosis not present

## 2021-11-19 DIAGNOSIS — M47816 Spondylosis without myelopathy or radiculopathy, lumbar region: Secondary | ICD-10-CM | POA: Diagnosis not present

## 2021-12-03 DIAGNOSIS — M47816 Spondylosis without myelopathy or radiculopathy, lumbar region: Secondary | ICD-10-CM | POA: Diagnosis not present

## 2021-12-24 DIAGNOSIS — M47816 Spondylosis without myelopathy or radiculopathy, lumbar region: Secondary | ICD-10-CM | POA: Diagnosis not present

## 2021-12-24 DIAGNOSIS — M542 Cervicalgia: Secondary | ICD-10-CM | POA: Diagnosis not present

## 2022-01-03 DIAGNOSIS — M4726 Other spondylosis with radiculopathy, lumbar region: Secondary | ICD-10-CM | POA: Diagnosis not present

## 2022-01-03 DIAGNOSIS — M5416 Radiculopathy, lumbar region: Secondary | ICD-10-CM | POA: Diagnosis not present

## 2022-01-15 DIAGNOSIS — M5416 Radiculopathy, lumbar region: Secondary | ICD-10-CM | POA: Diagnosis not present

## 2022-01-15 DIAGNOSIS — M4726 Other spondylosis with radiculopathy, lumbar region: Secondary | ICD-10-CM | POA: Diagnosis not present

## 2022-02-01 DIAGNOSIS — M4726 Other spondylosis with radiculopathy, lumbar region: Secondary | ICD-10-CM | POA: Diagnosis not present

## 2022-02-01 DIAGNOSIS — M5416 Radiculopathy, lumbar region: Secondary | ICD-10-CM | POA: Diagnosis not present

## 2022-02-11 DIAGNOSIS — M4726 Other spondylosis with radiculopathy, lumbar region: Secondary | ICD-10-CM | POA: Diagnosis not present

## 2022-02-11 DIAGNOSIS — M5416 Radiculopathy, lumbar region: Secondary | ICD-10-CM | POA: Diagnosis not present

## 2022-02-18 DIAGNOSIS — M47816 Spondylosis without myelopathy or radiculopathy, lumbar region: Secondary | ICD-10-CM | POA: Diagnosis not present

## 2022-03-04 ENCOUNTER — Other Ambulatory Visit: Payer: Self-pay | Admitting: Obstetrics and Gynecology

## 2022-03-04 DIAGNOSIS — Z1231 Encounter for screening mammogram for malignant neoplasm of breast: Secondary | ICD-10-CM

## 2022-03-05 DIAGNOSIS — M47816 Spondylosis without myelopathy or radiculopathy, lumbar region: Secondary | ICD-10-CM | POA: Diagnosis not present

## 2022-03-05 DIAGNOSIS — M2142 Flat foot [pes planus] (acquired), left foot: Secondary | ICD-10-CM | POA: Diagnosis not present

## 2022-03-05 DIAGNOSIS — E559 Vitamin D deficiency, unspecified: Secondary | ICD-10-CM | POA: Diagnosis not present

## 2022-03-05 DIAGNOSIS — M25511 Pain in right shoulder: Secondary | ICD-10-CM | POA: Diagnosis not present

## 2022-03-05 DIAGNOSIS — M4316 Spondylolisthesis, lumbar region: Secondary | ICD-10-CM | POA: Diagnosis not present

## 2022-03-05 DIAGNOSIS — M1612 Unilateral primary osteoarthritis, left hip: Secondary | ICD-10-CM | POA: Diagnosis not present

## 2022-03-05 DIAGNOSIS — M25512 Pain in left shoulder: Secondary | ICD-10-CM | POA: Diagnosis not present

## 2022-03-05 DIAGNOSIS — G8929 Other chronic pain: Secondary | ICD-10-CM | POA: Diagnosis not present

## 2022-03-05 DIAGNOSIS — M722 Plantar fascial fibromatosis: Secondary | ICD-10-CM | POA: Diagnosis not present

## 2022-03-05 DIAGNOSIS — M5136 Other intervertebral disc degeneration, lumbar region: Secondary | ICD-10-CM | POA: Diagnosis not present

## 2022-03-05 DIAGNOSIS — M79604 Pain in right leg: Secondary | ICD-10-CM | POA: Diagnosis not present

## 2022-03-05 DIAGNOSIS — M2141 Flat foot [pes planus] (acquired), right foot: Secondary | ICD-10-CM | POA: Diagnosis not present

## 2022-03-05 DIAGNOSIS — M545 Low back pain, unspecified: Secondary | ICD-10-CM | POA: Diagnosis not present

## 2022-04-04 DIAGNOSIS — Z6839 Body mass index (BMI) 39.0-39.9, adult: Secondary | ICD-10-CM | POA: Diagnosis not present

## 2022-04-04 DIAGNOSIS — N611 Abscess of the breast and nipple: Secondary | ICD-10-CM | POA: Diagnosis not present

## 2022-04-15 ENCOUNTER — Ambulatory Visit
Admission: RE | Admit: 2022-04-15 | Discharge: 2022-04-15 | Disposition: A | Discharge status: Home / Self Care | Payer: 59 | Source: Ambulatory Visit

## 2022-04-15 DIAGNOSIS — Z1231 Encounter for screening mammogram for malignant neoplasm of breast: Secondary | ICD-10-CM

## 2022-06-06 IMAGING — MG MM DIGITAL SCREENING BILAT W/ TOMO AND CAD
7 series · 8 of 19 positions shown · non-contrast
Comparison: Previous exam(s).

ACR Breast Density Category a: The breast tissue is almost entirely
fatty.

CLINICAL DATA: Screening.

EXAM:
DIGITAL SCREENING BILATERAL MAMMOGRAM WITH TOMOSYNTHESIS AND CAD
TECHNIQUE: Bilateral screening digital craniocaudal and mediolateral oblique
mammograms were obtained. Bilateral screening digital breast
tomosynthesis was performed. The images were evaluated with
computer-aided detection.

[R CC synth-2D]
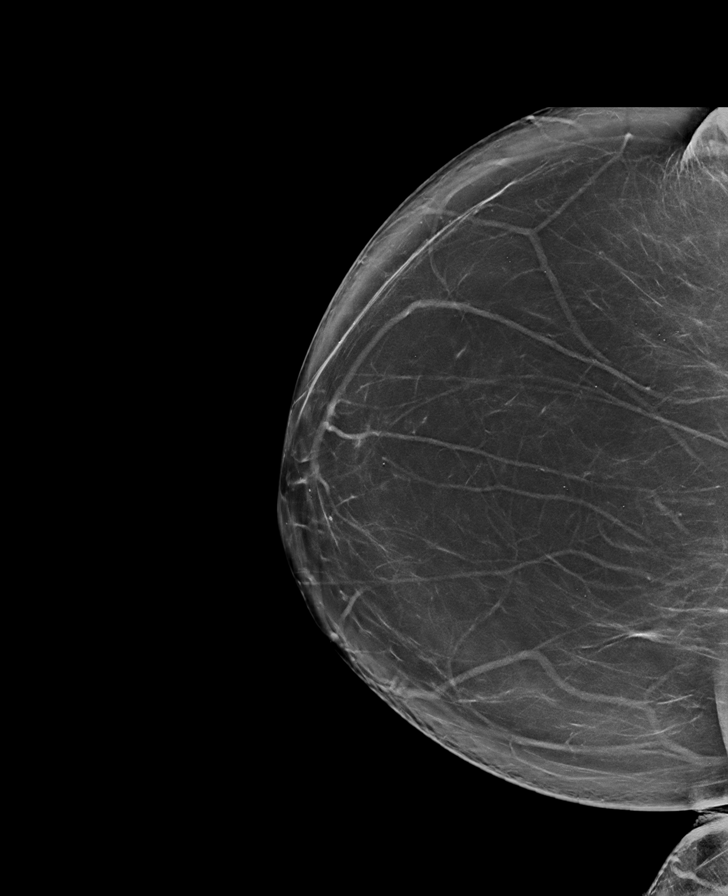

[R MLO synth-2D]
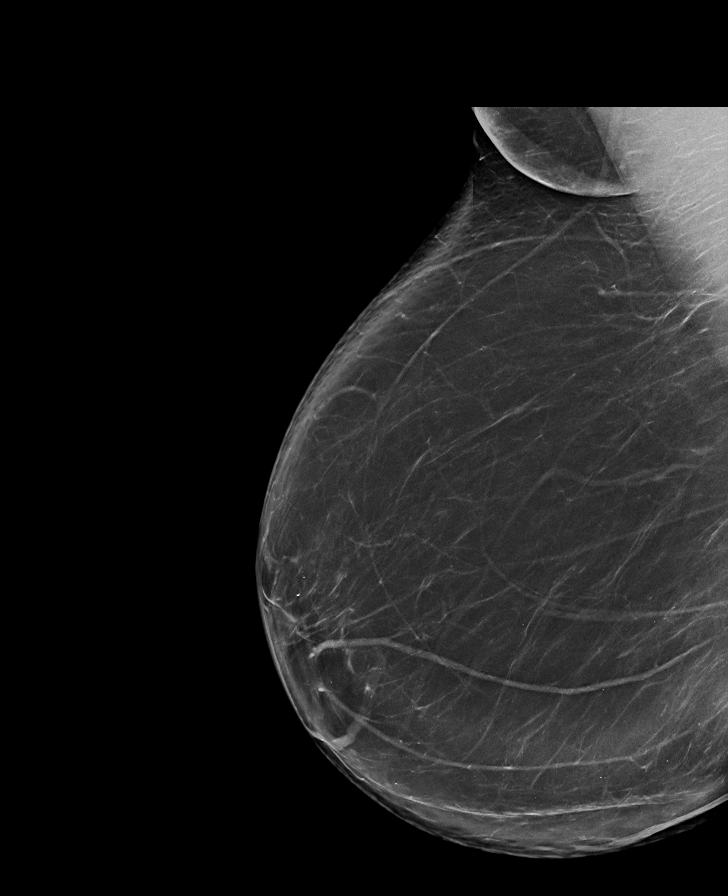

[L MLO synth-2D]
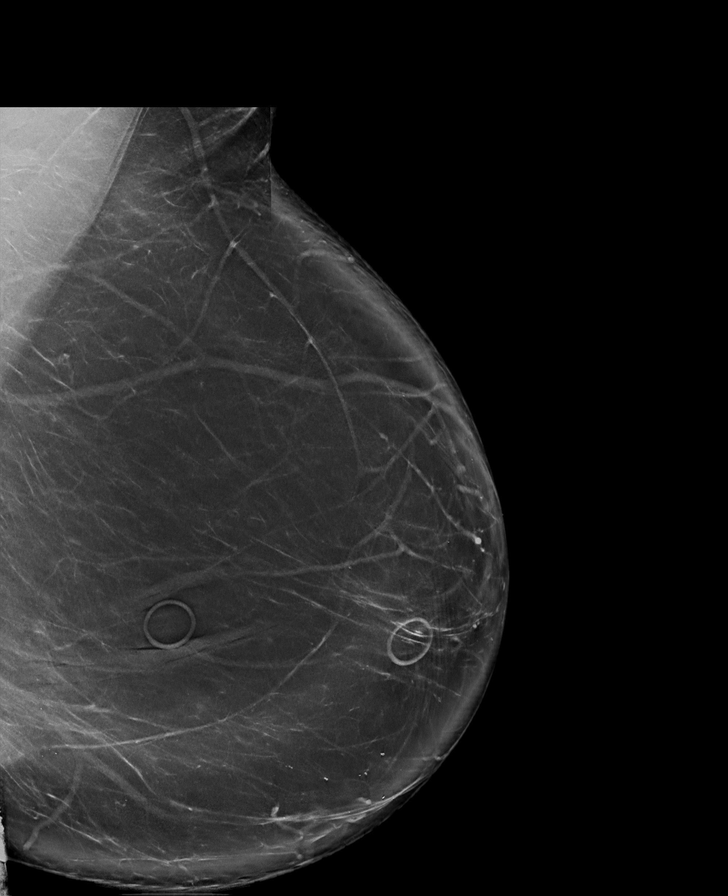

[L CC synth-2D]
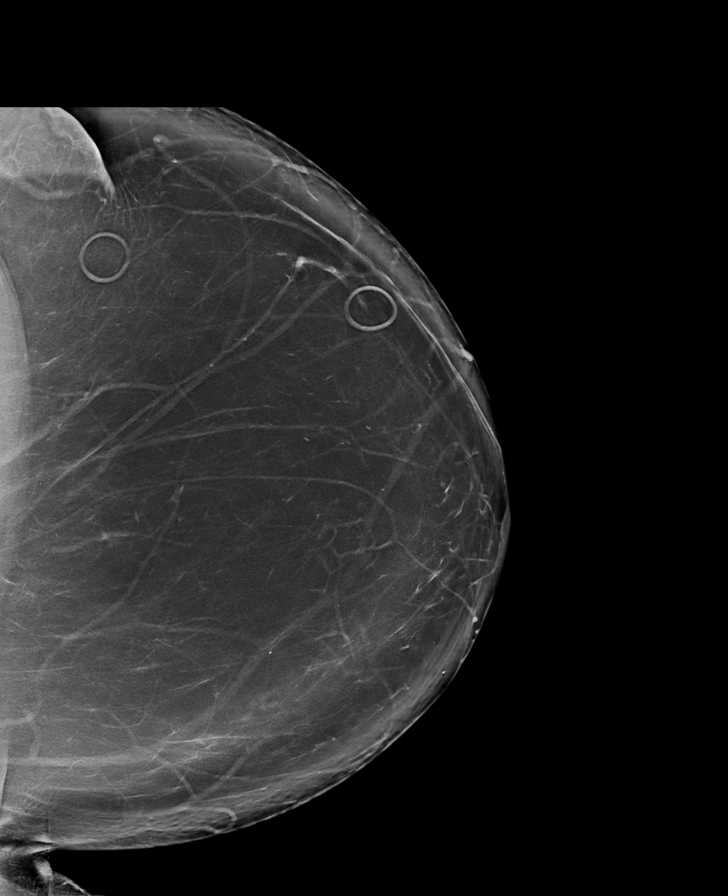

[R CC tomo · 2 of 100 frames shown]
[frame 33/100]
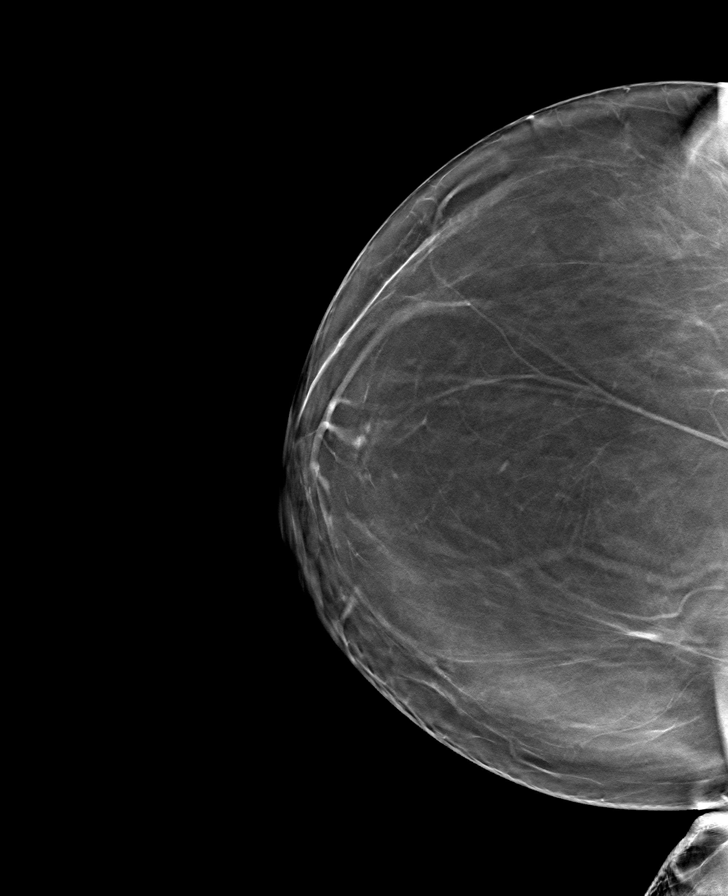
[frame 51/100]
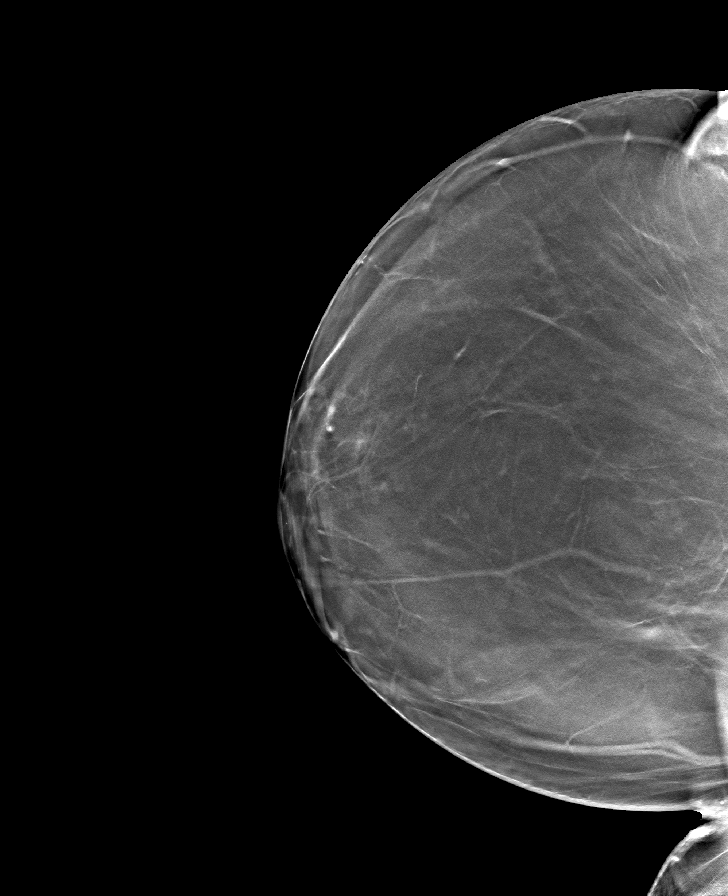

[L CC tomo · tomo slice 56/111.0]
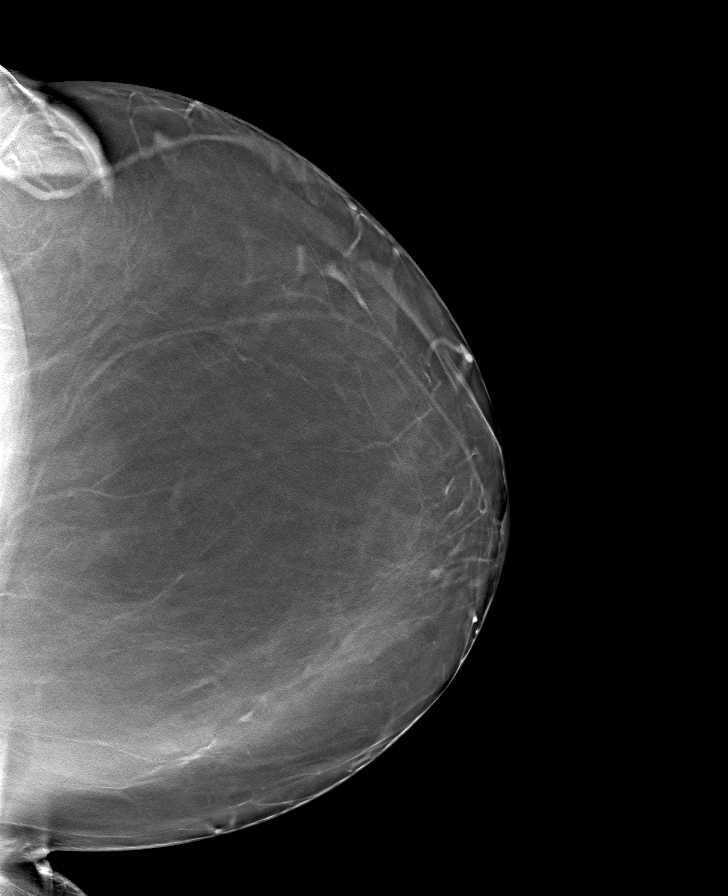

[R MLO tomo · tomo slice 53/104.0]
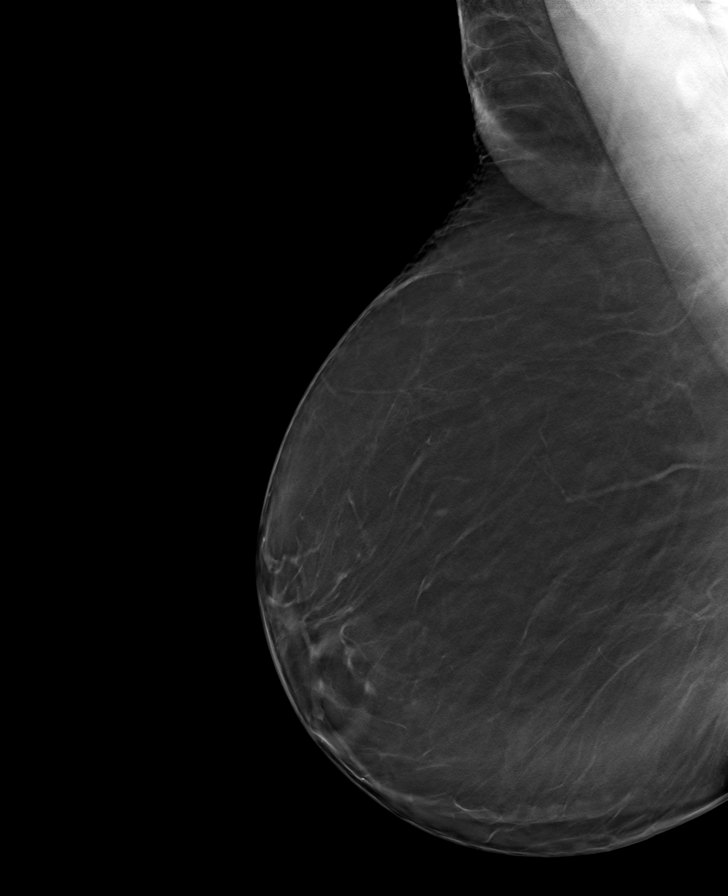

[8 of 19 positions shown; findings below may reference images not displayed]

FINDINGS: There are no findings suspicious for malignancy.
IMPRESSION: No mammographic evidence of malignancy. A result letter of this
screening mammogram will be mailed directly to the patient.

RECOMMENDATION:
Screening mammogram in one year. (Code:0E-3-N98)

BI-RADS CATEGORY  1: Negative.

## 2023-05-02 ENCOUNTER — Other Ambulatory Visit: Payer: Self-pay | Admitting: Physician Assistant

## 2023-05-02 DIAGNOSIS — Z1231 Encounter for screening mammogram for malignant neoplasm of breast: Secondary | ICD-10-CM

## 2023-05-16 ENCOUNTER — Ambulatory Visit
Admission: RE | Admit: 2023-05-16 | Discharge: 2023-05-16 | Disposition: A | Discharge status: Home / Self Care | Payer: PRIVATE HEALTH INSURANCE | Source: Ambulatory Visit

## 2023-05-16 DIAGNOSIS — Z1231 Encounter for screening mammogram for malignant neoplasm of breast: Secondary | ICD-10-CM

## 2024-05-21 ENCOUNTER — Other Ambulatory Visit: Payer: Self-pay | Admitting: Obstetrics and Gynecology

## 2024-05-21 ENCOUNTER — Ambulatory Visit
Admission: RE | Admit: 2024-05-21 | Discharge: 2024-05-21 | Disposition: A | Discharge status: Home / Self Care | Payer: PRIVATE HEALTH INSURANCE | Source: Ambulatory Visit | Attending: Obstetrics and Gynecology | Admitting: Obstetrics and Gynecology

## 2024-05-21 DIAGNOSIS — Z1231 Encounter for screening mammogram for malignant neoplasm of breast: Secondary | ICD-10-CM

## 2024-11-25 ENCOUNTER — Ambulatory Visit: Payer: PRIVATE HEALTH INSURANCE | Admitting: Family

## 2025-01-04 ENCOUNTER — Ambulatory Visit: Admitting: General Practice
